# Patient Record
Sex: Female | Born: 1970 | Race: White | Hispanic: No | Marital: Married | Smoking: Current every day smoker
Health system: Southern US, Community
[De-identification: ages and names within clinical notes are randomized; demographics above are authoritative.]

## PROBLEM LIST (undated history)

## (undated) DIAGNOSIS — R059 Cough, unspecified: Secondary | ICD-10-CM

## (undated) DIAGNOSIS — R05 Cough: Secondary | ICD-10-CM

## (undated) DIAGNOSIS — J40 Bronchitis, not specified as acute or chronic: Secondary | ICD-10-CM

## (undated) HISTORY — DX: Bronchitis, not specified as acute or chronic: J40

## (undated) HISTORY — DX: Cough: R05

## (undated) HISTORY — DX: Cough, unspecified: R05.9

---

## 2000-09-14 ENCOUNTER — Other Ambulatory Visit: Admission: RE | Admit: 2000-09-14 | Discharge: 2000-09-14 | Payer: Self-pay | Admitting: Obstetrics and Gynecology

## 2002-04-02 ENCOUNTER — Other Ambulatory Visit: Admission: RE | Admit: 2002-04-02 | Discharge: 2002-04-02 | Payer: Self-pay | Admitting: Obstetrics and Gynecology

## 2002-08-07 ENCOUNTER — Other Ambulatory Visit: Admission: RE | Admit: 2002-08-07 | Discharge: 2002-08-07 | Payer: Self-pay | Admitting: Obstetrics and Gynecology

## 2002-08-07 ENCOUNTER — Other Ambulatory Visit: Admission: RE | Admit: 2002-08-07 | Discharge: 2002-08-07 | Payer: Self-pay | Admitting: Obstetrics & Gynecology

## 2002-09-25 ENCOUNTER — Inpatient Hospital Stay (HOSPITAL_COMMUNITY): Admission: AD | Admit: 2002-09-25 | Discharge: 2002-09-25 | Payer: Self-pay | Admitting: Obstetrics and Gynecology

## 2002-09-29 ENCOUNTER — Encounter: Payer: Self-pay | Admitting: Obstetrics and Gynecology

## 2002-09-29 ENCOUNTER — Ambulatory Visit (HOSPITAL_COMMUNITY): Admission: RE | Admit: 2002-09-29 | Discharge: 2002-09-29 | Payer: Self-pay | Admitting: Obstetrics and Gynecology

## 2002-10-12 ENCOUNTER — Inpatient Hospital Stay (HOSPITAL_COMMUNITY): Admission: AD | Admit: 2002-10-12 | Discharge: 2002-10-12 | Payer: Self-pay | Admitting: Obstetrics and Gynecology

## 2002-10-16 ENCOUNTER — Inpatient Hospital Stay (HOSPITAL_COMMUNITY): Admission: AD | Admit: 2002-10-16 | Discharge: 2002-10-16 | Payer: Self-pay | Admitting: Obstetrics and Gynecology

## 2002-10-18 ENCOUNTER — Inpatient Hospital Stay (HOSPITAL_COMMUNITY): Admission: AD | Admit: 2002-10-18 | Discharge: 2002-10-20 | Payer: Self-pay | Admitting: Obstetrics & Gynecology

## 2002-10-18 HISTORY — PX: OTHER SURGICAL HISTORY: SHX169

## 2011-07-27 ENCOUNTER — Encounter: Payer: Self-pay | Admitting: Pulmonary Disease

## 2011-07-27 ENCOUNTER — Ambulatory Visit (INDEPENDENT_AMBULATORY_CARE_PROVIDER_SITE_OTHER): Payer: 59 | Admitting: Pulmonary Disease

## 2011-07-27 VITALS — BP 102/64 | HR 92 | Temp 97.9°F | Ht 64.0 in | Wt 126.6 lb

## 2011-07-27 DIAGNOSIS — M545 Low back pain, unspecified: Secondary | ICD-10-CM

## 2011-07-27 DIAGNOSIS — F17200 Nicotine dependence, unspecified, uncomplicated: Secondary | ICD-10-CM

## 2011-07-27 DIAGNOSIS — J42 Unspecified chronic bronchitis: Secondary | ICD-10-CM

## 2011-07-27 DIAGNOSIS — F419 Anxiety disorder, unspecified: Secondary | ICD-10-CM

## 2011-07-27 DIAGNOSIS — F341 Dysthymic disorder: Secondary | ICD-10-CM

## 2011-07-27 DIAGNOSIS — F32A Depression, unspecified: Secondary | ICD-10-CM

## 2011-07-27 DIAGNOSIS — F17210 Nicotine dependence, cigarettes, uncomplicated: Secondary | ICD-10-CM

## 2011-07-27 DIAGNOSIS — R059 Cough, unspecified: Secondary | ICD-10-CM

## 2011-07-27 DIAGNOSIS — J209 Acute bronchitis, unspecified: Secondary | ICD-10-CM

## 2011-07-27 DIAGNOSIS — R05 Cough: Secondary | ICD-10-CM | POA: Insufficient documentation

## 2011-07-27 MED ORDER — METHYLPREDNISOLONE ACETATE 80 MG/ML IJ SUSP
80.0000 mg | Freq: Once | INTRAMUSCULAR | Status: AC
  Administered 2011-07-27: 80 mg via INTRAMUSCULAR

## 2011-07-28 ENCOUNTER — Encounter: Payer: Self-pay | Admitting: Pulmonary Disease

## 2011-07-28 ENCOUNTER — Ambulatory Visit (INDEPENDENT_AMBULATORY_CARE_PROVIDER_SITE_OTHER)
Admission: RE | Admit: 2011-07-28 | Discharge: 2011-07-28 | Disposition: A | Discharge status: Home / Self Care | Payer: Self-pay | Source: Ambulatory Visit | Attending: Pulmonary Disease | Admitting: Pulmonary Disease

## 2011-07-28 ENCOUNTER — Telehealth: Payer: Self-pay | Admitting: Pulmonary Disease

## 2011-07-28 DIAGNOSIS — M545 Low back pain, unspecified: Secondary | ICD-10-CM | POA: Insufficient documentation

## 2011-07-28 DIAGNOSIS — J209 Acute bronchitis, unspecified: Secondary | ICD-10-CM | POA: Insufficient documentation

## 2011-07-28 DIAGNOSIS — R05 Cough: Secondary | ICD-10-CM

## 2011-07-28 DIAGNOSIS — F419 Anxiety disorder, unspecified: Secondary | ICD-10-CM | POA: Insufficient documentation

## 2011-07-28 DIAGNOSIS — F17210 Nicotine dependence, cigarettes, uncomplicated: Secondary | ICD-10-CM | POA: Insufficient documentation

## 2011-07-28 NOTE — Progress Notes (Signed)
Subjective:    Patient ID: Ariel Garrett, female    DOB: 03-29-1971, 40 y.o.   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  Problem List:   CigSmoker>> Chronic Bronchitis w/ acute exac>>       She is a 1ppd smoker having started in her teens & smoked up to 1ppd, currently still at 1ppd; she describes a mild chronic cough in AMs w/ sm amt beige phlegm on most days; no hx freq or recurrent acute infections- w/o pneumonias/ bronchitis/ etc; she denies prev hx breathing problems & never been on inhalers etc (no hx asthma etc)...  Presents w/ 3wk hx URI starting w/ head congestion, drainage to chest, cough initially dry, then productive sm amt beige phlegm, no hemoptysis, denies f/c/s, denies reflux or nighttime exac;   Exam reveals decr BS & scat rhonchi w/ end exp wheezing & congestion;  CXR ordered & pending;  ==> we discussed Rx w/ smoking cessation, Advair 250Bid, Depo80, Prednisone 20mg  (#12- 3d tapering sched), ZPak, Mucinex, Fluids, and she may use Delsym for cough...  Rec follow up in ~40month w/ PFT at baseline to guide meds going forward & underline the critical need to quit smoking!!!  S/P BTL 12/03 by DrWein  DJD/ LBP > on DICLOFENAC 75mg  Bid PRN for pain...  ANXIETY & DEPRESSION > on ALPRAZOLAM 0.5mg  Prn for nerves, and CITALOPRAM 20mg  daily for depression...   Past Medical History  Diagnosis Date  . Bronchitis   . Cough     Past Surgical History  Procedure Date  . Tubaligation 10-18-02    Outpatient Encounter Prescriptions as of 07/27/2011  Medication Sig Dispense Refill  . ALPRAZolam (XANAX) 0.5 MG tablet Take 0.5 mg by mouth at bedtime as needed.        Marland Kitchen azithromycin (ZITHROMAX Z-PAK) 250 MG tablet Take 250 mg by mouth daily.        . citalopram (CELEXA) 20 MG tablet Take 20 mg by mouth daily.        . diclofenac (VOLTAREN) 75 MG EC tablet As  needed for back pain       . Fluticasone-Salmeterol (ADVAIR) 250-50 MCG/DOSE AEPB Inhale 1 puff into the lungs every 12 (twelve) hours.         Facility-Administered Encounter Medications as of 07/27/2011  Medication Dose Route Frequency Provider Last Rate Last Dose  . methylPREDNISolone acetate (DEPO-MEDROL) injection 80 mg  80 mg Intramuscular Once Michele Mcalpine, MD   80 mg at 07/27/11 1440    No Known Allergies   Current Medications, Allergies, Past Medical History, Past Surgical History, Family History, and Social History were reviewed in [employer] [facility] electronic medical record.    Review of Systems    Constitutional:  Denies F/C/S, anorexia, unexpected weight change. HEENT:  No HA, visual changes, earache, +nasal congestion/ drainage... Resp:  +cough, congestion, sl SOB, & end exp wheezing... Cardio:  No CP, palpit, orthopnea, edema. GI:  Denies N/V/D/C or blood in stool; no reflux, abd pain, distention, or gas. GU:  No dysuria, freq, urgency, hematuria, or flank pain. MS:  Denies joint pain, swelling, tenderness, or decr ROM; no neck pain, back pain, etc. Neuro:  No tremors, seizures, dizziness, syncope, weakness, numbness, gait abn. Skin:  No suspicious lesions or skin rash. Heme:  No adenopathy, bruising, bleeding. Psyche: Denies confusion, sleep disturbance, hallucinations, anxiety, depression.  Objective:   Physical Exam    WD, WN, 40 y/o WF in NAD... Vital Signs:  Reviewed & BP= 102/64 General:  Alert & oriented; pleasant & cooperative... HEENT:  [place]/AT, EOM-wnl, PERRLA, Fundi-benign, EACs-clear, TMs-wnl, NOSE- sl congested, THROAT-clear & wnl. Neck:  Supple w/ full ROM; no JVD; normal carotid impulses w/o bruits; no thyromegaly or nodules palpated; no lymphadenopathy. Chest:  Decr BS at bases w/ end-exp wheezing & rhonchi heard... Heart:  Regular Rhythm; norm S1 & S2 without murmurs/ rubs/ or gallops detected... Abdomen:  Soft & nontender; normal bowel sounds; no  organomegaly or masses palpated... Ext:  Normal ROM; without deformities or arthritic changes; no varicose veins, venous insuffic, or edema;  Pulses intact w/o bruits... Neuro:  CNs II-XII intact; motor testing normal; sensory testing normal; gait normal & balance OK... Derm:  No lesions noted; no rash etc... Lymph:  No cervical, supraclavicular, axillary, or inguinal adenopathy palpated...   CXR:  Pending- will review when avail...  Assessment & Plan:   Cigarette Smoker >> Prob Chronic Bronchitis w/ acute exacerbation precipitated by her recent URI. >>        We reviewed the pathophysiology of ChrBronchitis & it's relation to CigSmoking & implications for her future health;  Current acute exac is merely the tip of the iceberg so to speak & she understands this;  We discussed acute treatment w/ ZPak & Depo/ Prednisone, plus longer term [id number] & Mucinex/ Fluids;  Smoking cessation is imperitive & she was rec to call the 1-800-QUIT NOW phone line, try Nicotine replacement Rx avail OTC, E-cig, or let us know if she wants a prescription for Chantix...  We plan short term follow up & will consider PFTs when she is better & approaching her baseline...  LBP >> on Diclofenac as needed from her Primary care provider...  Anxiety/ Depression >> on Alprazolam & Celexa per her primary care provider.Marland KitchenMarland Kitchen

## 2011-07-28 NOTE — Telephone Encounter (Signed)
Called and spoke with pt about her test results per SN.  See result note.

## 2011-07-28 NOTE — Telephone Encounter (Signed)
SN, pls advise results, thanks!

## 2011-07-28 NOTE — Patient Instructions (Signed)
We reviewed the mechanism of your cough & it's relationship to your smoking & chronic lung disease...  You must quit smoking now >>     call the 1-800-QUIT NOW help line...    Try the OTC available nicotine replacement products (patches, gum, lozenges, etc)...    Try the E-cig products...    Let me know if you need a prescription for CHANTIX...   We gave you a ZPak for any residual bronchial infection... We gave you a Depo shot & a prescription for Prednisone to eliminate the bronchial tube inflammation that is making you cough... We decided to start ADVAIR 250- one inhalation twice daily, for intermediate term control of the inflammation... Be sure to start the [facility] 600mg  2 tabs twice daily w/ lots of fluids for the congestion & thick mucus plugs... You may use DELSYM OTC- 2 tsp twice daily as needed for severe coughing spells...  Today we did a baseline CXR> we will notify you of this result when avail...  Let's plan short term follow up in 4-6 weeks to check your progress.Marland KitchenMarland Kitchen

## 2011-08-08 ENCOUNTER — Telehealth: Payer: Self-pay | Admitting: *Deleted

## 2011-08-08 MED ORDER — FLUCONAZOLE 150 MG PO TABS: 150.0000 mg | ORAL_TABLET | Freq: Every day | ORAL | Status: AC

## 2011-08-08 NOTE — Telephone Encounter (Signed)
I had patient show her tongue to Dr Maple Hudson as SN and TP is out of the office. Per CY-okay to call in diflucan 150mg  #2 take 1 po qd x 2 days no refills. Rx sent to pharmacy and pt aware.

## 2012-07-18 ENCOUNTER — Encounter: Payer: Self-pay | Admitting: Pulmonary Disease

## 2014-04-05 ENCOUNTER — Inpatient Hospital Stay (HOSPITAL_COMMUNITY): Payer: 59

## 2014-04-05 ENCOUNTER — Emergency Department (HOSPITAL_COMMUNITY): Payer: 59

## 2014-04-05 ENCOUNTER — Inpatient Hospital Stay (HOSPITAL_COMMUNITY): Payer: 59 | Admitting: Anesthesiology

## 2014-04-05 ENCOUNTER — Inpatient Hospital Stay: Admit: 2014-04-05 | Payer: Self-pay | Admitting: Orthopedic Surgery

## 2014-04-05 ENCOUNTER — Encounter (HOSPITAL_COMMUNITY): Payer: 59 | Admitting: Anesthesiology

## 2014-04-05 ENCOUNTER — Encounter (HOSPITAL_COMMUNITY): Payer: Self-pay | Admitting: Anesthesiology

## 2014-04-05 ENCOUNTER — Encounter (HOSPITAL_COMMUNITY)
Admission: EM | Disposition: A | Discharge status: Home / Self Care | Payer: Self-pay | Source: Home / Self Care | Attending: Orthopedic Surgery

## 2014-04-05 ENCOUNTER — Observation Stay (HOSPITAL_COMMUNITY)
Admission: EM | Admit: 2014-04-05 | Discharge: 2014-04-06 | Disposition: A | Discharge status: Home / Self Care | Payer: 59 | Attending: Orthopedic Surgery | Admitting: Orthopedic Surgery

## 2014-04-05 DIAGNOSIS — W108XXA Fall (on) (from) other stairs and steps, initial encounter: Secondary | ICD-10-CM | POA: Insufficient documentation

## 2014-04-05 DIAGNOSIS — F17200 Nicotine dependence, unspecified, uncomplicated: Secondary | ICD-10-CM | POA: Insufficient documentation

## 2014-04-05 DIAGNOSIS — S82401A Unspecified fracture of shaft of right fibula, initial encounter for closed fracture: Secondary | ICD-10-CM

## 2014-04-05 DIAGNOSIS — S82201A Unspecified fracture of shaft of right tibia, initial encounter for closed fracture: Secondary | ICD-10-CM | POA: Diagnosis present

## 2014-04-05 DIAGNOSIS — S82409A Unspecified fracture of shaft of unspecified fibula, initial encounter for closed fracture: Principal | ICD-10-CM

## 2014-04-05 DIAGNOSIS — Y92009 Unspecified place in unspecified non-institutional (private) residence as the place of occurrence of the external cause: Secondary | ICD-10-CM | POA: Insufficient documentation

## 2014-04-05 DIAGNOSIS — S82839A Other fracture of upper and lower end of unspecified fibula, initial encounter for closed fracture: Secondary | ICD-10-CM

## 2014-04-05 DIAGNOSIS — S82309A Unspecified fracture of lower end of unspecified tibia, initial encounter for closed fracture: Secondary | ICD-10-CM

## 2014-04-05 DIAGNOSIS — S82209A Unspecified fracture of shaft of unspecified tibia, initial encounter for closed fracture: Principal | ICD-10-CM | POA: Diagnosis present

## 2014-04-05 HISTORY — PX: TIBIA IM NAIL INSERTION: SHX2516

## 2014-04-05 LAB — CBC WITH DIFFERENTIAL/PLATELET
Basophils Absolute: 0.1 K/uL (ref 0.0–0.1)
Basophils Relative: 1 % (ref 0–1)
Eosinophils Absolute: 0.1 K/uL (ref 0.0–0.7)
Eosinophils Relative: 1 % (ref 0–5)
HCT: 40.2 % (ref 36.0–46.0)
Hemoglobin: 13.8 g/dL (ref 12.0–15.0)
Lymphocytes Relative: 31 % (ref 12–46)
Lymphs Abs: 2.6 K/uL (ref 0.7–4.0)
MCH: 31.8 pg (ref 26.0–34.0)
MCHC: 34.3 g/dL (ref 30.0–36.0)
MCV: 92.6 fL (ref 78.0–100.0)
Monocytes Absolute: 0.5 K/uL (ref 0.1–1.0)
Monocytes Relative: 6 % (ref 3–12)
Neutro Abs: 5.1 K/uL (ref 1.7–7.7)
Neutrophils Relative %: 61 % (ref 43–77)
Platelets: 392 K/uL (ref 150–400)
RBC: 4.34 MIL/uL (ref 3.87–5.11)
RDW: 12.8 % (ref 11.5–15.5)
WBC: 8.4 K/uL (ref 4.0–10.5)

## 2014-04-05 LAB — BASIC METABOLIC PANEL
BUN: 7 mg/dL (ref 6–23)
CO2: 21 mEq/L (ref 19–32)
Calcium: 8.8 mg/dL (ref 8.4–10.5)
Chloride: 102 mEq/L (ref 96–112)
Creatinine, Ser: 0.66 mg/dL (ref 0.50–1.10)
GFR calc Af Amer: >90 mL/min (ref >90)
GFR calc non Af Amer: >90 mL/min (ref >90)
Glucose, Bld: 100 mg/dL — ABNORMAL HIGH (ref 70–99)
Potassium: 3.8 mEq/L (ref 3.7–5.3)
Sodium: 139 mEq/L (ref 137–147)

## 2014-04-05 LAB — MRSA PCR SCREENING: MRSA BY PCR: NEGATIVE

## 2014-04-05 LAB — ETHANOL: Alcohol, Ethyl (B): 237 mg/dL — ABNORMAL HIGH (ref 0–11)

## 2014-04-05 SURGERY — INSERTION, INTRAMEDULLARY ROD, TIBIA
Anesthesia: General | Site: Leg Lower | Laterality: Right

## 2014-04-05 MED ORDER — NEOSTIGMINE METHYLSULFATE 10 MG/10ML IV SOLN
INTRAVENOUS | Status: AC
  Filled 2014-04-05: qty 1

## 2014-04-05 MED ORDER — FOLIC ACID 1 MG PO TABS
1.0000 mg | ORAL_TABLET | Freq: Every day | ORAL | Status: DC
  Administered 2014-04-05 – 2014-04-06 (×2): 1 mg via ORAL
  Filled 2014-04-05 (×2): qty 1

## 2014-04-05 MED ORDER — HYDROMORPHONE HCL PF 1 MG/ML IJ SOLN
0.5000 mg | INTRAMUSCULAR | Status: DC | PRN
  Administered 2014-04-05 – 2014-04-06 (×5): 1 mg via INTRAVENOUS
  Filled 2014-04-05 (×5): qty 1

## 2014-04-05 MED ORDER — ONDANSETRON HCL 4 MG/2ML IJ SOLN
INTRAMUSCULAR | Status: AC
Start: 2014-04-05 — End: 2014-04-05
  Filled 2014-04-05: qty 2

## 2014-04-05 MED ORDER — THIAMINE HCL 100 MG/ML IJ SOLN
100.0000 mg | Freq: Every day | INTRAMUSCULAR | Status: DC
  Filled 2014-04-05 (×2): qty 1

## 2014-04-05 MED ORDER — HYDROMORPHONE HCL PF 1 MG/ML IJ SOLN: 1.0000 mg | INTRAMUSCULAR | Status: DC | PRN

## 2014-04-05 MED ORDER — HYDROMORPHONE HCL PF 1 MG/ML IJ SOLN
INTRAMUSCULAR | Status: AC
  Administered 2014-04-05: 0.5 mg via INTRAVENOUS
  Filled 2014-04-05: qty 1

## 2014-04-05 MED ORDER — CEFAZOLIN SODIUM 1-5 GM-% IV SOLN
INTRAVENOUS | Status: AC
  Filled 2014-04-05: qty 50

## 2014-04-05 MED ORDER — METHOCARBAMOL 1000 MG/10ML IJ SOLN
500.0000 mg | Freq: Four times a day (QID) | INTRAVENOUS | Status: DC | PRN
  Filled 2014-04-05: qty 5

## 2014-04-05 MED ORDER — FENTANYL CITRATE 0.05 MG/ML IJ SOLN
INTRAMUSCULAR | Status: DC | PRN
  Administered 2014-04-05 (×5): 50 ug via INTRAVENOUS

## 2014-04-05 MED ORDER — OXYCODONE HCL 5 MG PO TABS: 5.0000 mg | ORAL_TABLET | Freq: Once | ORAL | Status: AC | PRN

## 2014-04-05 MED ORDER — DIPHENHYDRAMINE HCL 12.5 MG/5ML PO ELIX: 12.5000 mg | ORAL_SOLUTION | ORAL | Status: DC | PRN

## 2014-04-05 MED ORDER — LIDOCAINE HCL (CARDIAC) 20 MG/ML IV SOLN
INTRAVENOUS | Status: AC
  Filled 2014-04-05: qty 5

## 2014-04-05 MED ORDER — ENOXAPARIN SODIUM 40 MG/0.4ML ~~LOC~~ SOLN
40.0000 mg | SUBCUTANEOUS | Status: DC
  Administered 2014-04-06: 40 mg via SUBCUTANEOUS
  Filled 2014-04-05 (×2): qty 0.4

## 2014-04-05 MED ORDER — CITALOPRAM HYDROBROMIDE 20 MG PO TABS
20.0000 mg | ORAL_TABLET | Freq: Every day | ORAL | Status: DC
  Administered 2014-04-05 – 2014-04-06 (×2): 20 mg via ORAL
  Filled 2014-04-05 (×2): qty 1

## 2014-04-05 MED ORDER — SUCCINYLCHOLINE CHLORIDE 20 MG/ML IJ SOLN
INTRAMUSCULAR | Status: DC | PRN
  Administered 2014-04-05: 100 mg via INTRAVENOUS

## 2014-04-05 MED ORDER — LORAZEPAM 1 MG PO TABS
1.0000 mg | ORAL_TABLET | Freq: Four times a day (QID) | ORAL | Status: DC | PRN
  Administered 2014-04-05 (×2): 1 mg via ORAL
  Filled 2014-04-05 (×2): qty 1

## 2014-04-05 MED ORDER — MIDAZOLAM HCL 2 MG/2ML IJ SOLN
2.0000 mg | Freq: Once | INTRAMUSCULAR | Status: AC
  Administered 2014-04-05: 1 mg via INTRAVENOUS
  Filled 2014-04-05: qty 2

## 2014-04-05 MED ORDER — ONDANSETRON HCL 4 MG PO TABS: 4.0000 mg | ORAL_TABLET | Freq: Four times a day (QID) | ORAL | Status: DC | PRN

## 2014-04-05 MED ORDER — CEFAZOLIN SODIUM-DEXTROSE 2-3 GM-% IV SOLR
2.0000 g | INTRAVENOUS | Status: AC
  Administered 2014-04-05: 2 g via INTRAVENOUS
  Filled 2014-04-05: qty 50

## 2014-04-05 MED ORDER — SODIUM CHLORIDE 0.9 % IV SOLN
INTRAVENOUS | Status: DC
  Administered 2014-04-05 – 2014-04-06 (×2): via INTRAVENOUS

## 2014-04-05 MED ORDER — HYDROMORPHONE HCL PF 1 MG/ML IJ SOLN
0.2500 mg | INTRAMUSCULAR | Status: DC | PRN
Start: 2014-04-05 — End: 2014-04-06
  Administered 2014-04-05 (×2): 0.5 mg via INTRAVENOUS

## 2014-04-05 MED ORDER — PROPOFOL 10 MG/ML IV BOLUS
INTRAVENOUS | Status: AC
  Filled 2014-04-05: qty 20

## 2014-04-05 MED ORDER — DOCUSATE SODIUM 100 MG PO CAPS
100.0000 mg | ORAL_CAPSULE | Freq: Two times a day (BID) | ORAL | Status: DC
  Administered 2014-04-05 – 2014-04-06 (×3): 100 mg via ORAL
  Filled 2014-04-05 (×2): qty 1

## 2014-04-05 MED ORDER — ALPRAZOLAM 0.5 MG PO TABS: 0.5000 mg | ORAL_TABLET | Freq: Every evening | ORAL | Status: DC | PRN

## 2014-04-05 MED ORDER — EPHEDRINE SULFATE 50 MG/ML IJ SOLN
INTRAMUSCULAR | Status: DC | PRN
Start: 2014-04-05 — End: 2014-04-05
  Administered 2014-04-05: 10 mg via INTRAVENOUS

## 2014-04-05 MED ORDER — ADULT MULTIVITAMIN W/MINERALS CH
1.0000 | ORAL_TABLET | Freq: Every day | ORAL | Status: DC
  Administered 2014-04-05 – 2014-04-06 (×2): 1 via ORAL
  Filled 2014-04-05 (×2): qty 1

## 2014-04-05 MED ORDER — LACTATED RINGERS IV SOLN
INTRAVENOUS | Status: DC | PRN
  Administered 2014-04-05: 07:00:00 via INTRAVENOUS

## 2014-04-05 MED ORDER — HYDROMORPHONE HCL PF 1 MG/ML IJ SOLN
1.0000 mg | INTRAMUSCULAR | Status: DC | PRN
  Administered 2014-04-05: 1 mg via INTRAVENOUS
  Filled 2014-04-05: qty 1

## 2014-04-05 MED ORDER — VITAMIN B-1 100 MG PO TABS
100.0000 mg | ORAL_TABLET | Freq: Every day | ORAL | Status: DC
  Administered 2014-04-05 – 2014-04-06 (×2): 100 mg via ORAL
  Filled 2014-04-05 (×2): qty 1

## 2014-04-05 MED ORDER — 0.9 % SODIUM CHLORIDE (POUR BTL) OPTIME
TOPICAL | Status: DC | PRN
  Administered 2014-04-05: 1000 mL

## 2014-04-05 MED ORDER — SUCCINYLCHOLINE CHLORIDE 20 MG/ML IJ SOLN
INTRAMUSCULAR | Status: AC
  Filled 2014-04-05: qty 1

## 2014-04-05 MED ORDER — LORAZEPAM 2 MG/ML IJ SOLN: 1.0000 mg | Freq: Four times a day (QID) | INTRAMUSCULAR | Status: DC | PRN

## 2014-04-05 MED ORDER — ARTIFICIAL TEARS OP OINT
TOPICAL_OINTMENT | OPHTHALMIC | Status: AC
  Filled 2014-04-05: qty 3.5

## 2014-04-05 MED ORDER — ARTIFICIAL TEARS OP OINT
TOPICAL_OINTMENT | OPHTHALMIC | Status: DC | PRN
  Administered 2014-04-05: 1 via OPHTHALMIC

## 2014-04-05 MED ORDER — NEOSTIGMINE METHYLSULFATE 10 MG/10ML IV SOLN
INTRAVENOUS | Status: DC | PRN
  Administered 2014-04-05: 4 mg via INTRAVENOUS

## 2014-04-05 MED ORDER — HYDROMORPHONE HCL PF 1 MG/ML IJ SOLN
1.0000 mg | Freq: Once | INTRAMUSCULAR | Status: AC
  Administered 2014-04-05: 1 mg via INTRAVENOUS

## 2014-04-05 MED ORDER — MIDAZOLAM HCL 2 MG/2ML IJ SOLN
INTRAMUSCULAR | Status: AC
  Filled 2014-04-05: qty 2

## 2014-04-05 MED ORDER — OXYCODONE HCL 5 MG/5ML PO SOLN: 5.0000 mg | Freq: Once | ORAL | Status: AC | PRN

## 2014-04-05 MED ORDER — LIDOCAINE HCL (CARDIAC) 20 MG/ML IV SOLN
INTRAVENOUS | Status: DC | PRN
  Administered 2014-04-05: 100 mg via INTRAVENOUS

## 2014-04-05 MED ORDER — PROPOFOL 10 MG/ML IV BOLUS
INTRAVENOUS | Status: DC | PRN
  Administered 2014-04-05: 150 mg via INTRAVENOUS
  Administered 2014-04-05: 50 mg via INTRAVENOUS

## 2014-04-05 MED ORDER — GLYCOPYRROLATE 0.2 MG/ML IJ SOLN
INTRAMUSCULAR | Status: DC | PRN
  Administered 2014-04-05: 0.6 mg via INTRAVENOUS

## 2014-04-05 MED ORDER — ACETAMINOPHEN 325 MG PO TABS: 650.0000 mg | ORAL_TABLET | Freq: Four times a day (QID) | ORAL | Status: DC | PRN

## 2014-04-05 MED ORDER — METHOCARBAMOL 500 MG PO TABS
500.0000 mg | ORAL_TABLET | Freq: Four times a day (QID) | ORAL | Status: DC | PRN
Start: 2014-04-05 — End: 2014-04-06
  Administered 2014-04-05 – 2014-04-06 (×3): 500 mg via ORAL
  Filled 2014-04-05 (×3): qty 1

## 2014-04-05 MED ORDER — SENNA 8.6 MG PO TABS
2.0000 | ORAL_TABLET | Freq: Two times a day (BID) | ORAL | Status: DC
  Administered 2014-04-05 – 2014-04-06 (×3): 17.2 mg via ORAL
  Filled 2014-04-05 (×4): qty 2

## 2014-04-05 MED ORDER — OXYCODONE HCL 5 MG PO TABS
5.0000 mg | ORAL_TABLET | ORAL | Status: DC | PRN
  Administered 2014-04-05 – 2014-04-06 (×3): 10 mg via ORAL
  Filled 2014-04-05 (×3): qty 2

## 2014-04-05 MED ORDER — HYDROMORPHONE HCL PF 1 MG/ML IJ SOLN
1.0000 mg | Freq: Once | INTRAMUSCULAR | Status: DC
  Filled 2014-04-05: qty 1

## 2014-04-05 MED ORDER — ONDANSETRON HCL 4 MG/2ML IJ SOLN
INTRAMUSCULAR | Status: DC | PRN
  Administered 2014-04-05: 4 mg via INTRAVENOUS

## 2014-04-05 MED ORDER — GLYCOPYRROLATE 0.2 MG/ML IJ SOLN
INTRAMUSCULAR | Status: AC
  Filled 2014-04-05: qty 2

## 2014-04-05 MED ORDER — ROCURONIUM BROMIDE 50 MG/5ML IV SOLN
INTRAVENOUS | Status: AC
  Filled 2014-04-05: qty 1

## 2014-04-05 MED ORDER — SODIUM CHLORIDE 0.9 % IV SOLN
INTRAVENOUS | Status: DC
  Administered 2014-04-05: 75 mL/h via INTRAVENOUS

## 2014-04-05 MED ORDER — FENTANYL CITRATE 0.05 MG/ML IJ SOLN
INTRAMUSCULAR | Status: AC
  Filled 2014-04-05: qty 5

## 2014-04-05 MED ORDER — PROMETHAZINE HCL 25 MG/ML IJ SOLN: 6.2500 mg | INTRAMUSCULAR | Status: DC | PRN

## 2014-04-05 MED ORDER — ONDANSETRON HCL 4 MG/2ML IJ SOLN: 4.0000 mg | Freq: Four times a day (QID) | INTRAMUSCULAR | Status: DC | PRN

## 2014-04-05 MED ORDER — CELECOXIB 200 MG PO CAPS
200.0000 mg | ORAL_CAPSULE | Freq: Every day | ORAL | Status: DC
  Administered 2014-04-05 – 2014-04-06 (×2): 200 mg via ORAL
  Filled 2014-04-05 (×2): qty 1

## 2014-04-05 MED ORDER — ROCURONIUM BROMIDE 100 MG/10ML IV SOLN
INTRAVENOUS | Status: DC | PRN
  Administered 2014-04-05: 40 mg via INTRAVENOUS
  Administered 2014-04-05: 10 mg via INTRAVENOUS

## 2014-04-05 MED ORDER — CEFAZOLIN SODIUM 1-5 GM-% IV SOLN
INTRAVENOUS | Status: DC | PRN
  Administered 2014-04-05: 1 g via INTRAVENOUS

## 2014-04-05 SURGICAL SUPPLY — 69 items
BANDAGE ELASTIC 4 VELCRO ST LF (GAUZE/BANDAGES/DRESSINGS) IMPLANT
BANDAGE ESMARK 6X9 LF (GAUZE/BANDAGES/DRESSINGS) ×1 IMPLANT
BIT DRILL 3.8X6 NS (BIT) ×3 IMPLANT
BIT DRILL 4.4 NS (BIT) ×3 IMPLANT
BIT DRILL WIN 3.0 (BIT) ×2 IMPLANT
BIT DRILL WIN 3.0MM (BIT) ×1
BNDG COHESIVE 4X5 TAN STRL (GAUZE/BANDAGES/DRESSINGS) ×3 IMPLANT
BNDG COHESIVE 6X5 TAN STRL LF (GAUZE/BANDAGES/DRESSINGS) ×3 IMPLANT
BNDG ESMARK 6X9 LF (GAUZE/BANDAGES/DRESSINGS) ×3
COVER SURGICAL LIGHT HANDLE (MISCELLANEOUS) ×3 IMPLANT
CUFF TOURNIQUET SINGLE 34IN LL (TOURNIQUET CUFF) ×3 IMPLANT
CUFF TOURNIQUET SINGLE 44IN (TOURNIQUET CUFF) IMPLANT
DRAPE C-ARM 42X72 X-RAY (DRAPES) ×3 IMPLANT
DRAPE INCISE IOBAN 66X45 STRL (DRAPES) ×3 IMPLANT
DRAPE ORTHO SPLIT 77X108 STRL (DRAPES) ×4
DRAPE SURG ORHT 6 SPLT 77X108 (DRAPES) ×2 IMPLANT
DRAPE U-SHAPE 47X51 STRL (DRAPES) ×3 IMPLANT
DRSG ADAPTIC 3X8 NADH LF (GAUZE/BANDAGES/DRESSINGS) ×3 IMPLANT
DRSG MEPILEX BORDER 4X4 (GAUZE/BANDAGES/DRESSINGS) ×3 IMPLANT
ELECT REM PT RETURN 9FT ADLT (ELECTROSURGICAL) ×3
ELECTRODE REM PT RTRN 9FT ADLT (ELECTROSURGICAL) ×1 IMPLANT
EVACUATOR 1/8 PVC DRAIN (DRAIN) IMPLANT
GLOVE BIO SURGEON STRL SZ 6.5 (GLOVE) ×2 IMPLANT
GLOVE BIO SURGEON STRL SZ7 (GLOVE) IMPLANT
GLOVE BIO SURGEON STRL SZ8 (GLOVE) ×3 IMPLANT
GLOVE BIO SURGEONS STRL SZ 6.5 (GLOVE) ×1
GLOVE BIOGEL PI IND STRL 6.5 (GLOVE) ×1 IMPLANT
GLOVE BIOGEL PI IND STRL 7.5 (GLOVE) IMPLANT
GLOVE BIOGEL PI IND STRL 8 (GLOVE) ×1 IMPLANT
GLOVE BIOGEL PI INDICATOR 6.5 (GLOVE) ×2
GLOVE BIOGEL PI INDICATOR 7.5 (GLOVE)
GLOVE BIOGEL PI INDICATOR 8 (GLOVE) ×2
GLOVE SS BIOGEL STRL SZ 7 (GLOVE) ×1 IMPLANT
GLOVE SUPERSENSE BIOGEL SZ 7 (GLOVE) ×2
GOWN STRL REUS W/ TWL LRG LVL3 (GOWN DISPOSABLE) ×2 IMPLANT
GOWN STRL REUS W/TWL LRG LVL3 (GOWN DISPOSABLE) ×4
GUIDEWIRE BALL NOSE 80CM (WIRE) ×3 IMPLANT
KIT BASIN OR (CUSTOM PROCEDURE TRAY) ×3 IMPLANT
KIT ROOM TURNOVER OR (KITS) ×3 IMPLANT
NAIL FLEXIBLE WIN 2.5MM (Nail) ×3 IMPLANT
NAIL TIBIAL 8MMX31.5CM (Nail) ×3 IMPLANT
PACK ORTHO EXTREMITY (CUSTOM PROCEDURE TRAY) ×3 IMPLANT
PAD ARMBOARD 7.5X6 YLW CONV (MISCELLANEOUS) ×3 IMPLANT
PAD CAST 4YDX4 CTTN HI CHSV (CAST SUPPLIES) ×1 IMPLANT
PADDING CAST COTTON 4X4 STRL (CAST SUPPLIES) ×2
PADDING CAST COTTON 6X4 STRL (CAST SUPPLIES) ×3 IMPLANT
PIN GUIDE ACE (PIN) ×3 IMPLANT
SCREW ACECAP 36MM (Screw) ×6 IMPLANT
SCREW ACECAP 40MM (Screw) ×3 IMPLANT
SCREW ACECAP 44MM (Screw) ×3 IMPLANT
SCREW ACECAP 50MM (Screw) ×3 IMPLANT
SCREW PROXIMAL DEPUY (Screw) ×4 IMPLANT
SCREW PRXML FT 40X5.5XNS LF (Screw) ×1 IMPLANT
SCREW PRXML FT 55X5.5XNS TIB (Screw) ×1 IMPLANT
SPONGE GAUZE 4X4 12PLY (GAUZE/BANDAGES/DRESSINGS) ×3 IMPLANT
SPONGE LAP 18X18 X RAY DECT (DISPOSABLE) ×3 IMPLANT
STAPLER VISISTAT 35W (STAPLE) ×3 IMPLANT
SUT ETHILON 3 0 PS 1 (SUTURE) ×6 IMPLANT
SUT MNCRL AB 3-0 PS2 18 (SUTURE) ×3 IMPLANT
SUT VIC AB 0 CT1 27 (SUTURE) ×2
SUT VIC AB 0 CT1 27XBRD ANBCTR (SUTURE) ×1 IMPLANT
SUT VIC AB 2-0 CT1 27 (SUTURE) ×2
SUT VIC AB 2-0 CT1 TAPERPNT 27 (SUTURE) ×1 IMPLANT
TOWEL OR 17X24 6PK STRL BLUE (TOWEL DISPOSABLE) ×3 IMPLANT
TOWEL OR 17X26 10 PK STRL BLUE (TOWEL DISPOSABLE) ×3 IMPLANT
TUBE CONNECTING 12'X1/4 (SUCTIONS) ×1
TUBE CONNECTING 12X1/4 (SUCTIONS) ×2 IMPLANT
UNDERPAD 30X30 INCONTINENT (UNDERPADS AND DIAPERS) ×3 IMPLANT
YANKAUER SUCT BULB TIP NO VENT (SUCTIONS) ×3 IMPLANT

## 2014-04-05 NOTE — Anesthesia Preprocedure Evaluation (Signed)
Anesthesia Evaluation  Patient identified by MRN, date of birth, ID band Patient awake    Reviewed: Allergy & Precautions, H&P   Airway Mallampati: I  Neck ROM: Full    Dental   Pulmonary COPDCurrent Smoker,  breath sounds clear to auscultation        Cardiovascular Rhythm:Regular Rate:Normal     Neuro/Psych    GI/Hepatic (+)     substance abuse  alcohol use,   Endo/Other    Renal/GU      Musculoskeletal   Abdominal   Peds  Hematology   Anesthesia Other Findings   Reproductive/Obstetrics                           Anesthesia Physical Anesthesia Plan  ASA: II  Anesthesia Plan: General   Post-op Pain Management:    Induction: Intravenous  Airway Management Planned: Oral ETT  Additional Equipment:   Intra-op Plan:   Post-operative Plan: Extubation in OR  Informed Consent: I have reviewed the patients History and Physical, chart, labs and discussed the procedure including the risks, benefits and alternatives for the proposed anesthesia with the patient or authorized representative who has indicated his/her understanding and acceptance.   Dental advisory given  Plan Discussed with: CRNA and Surgeon  Anesthesia Plan Comments:         Anesthesia Quick Evaluation

## 2014-04-05 NOTE — ED Provider Notes (Signed)
CSN: [id number]     Arrival date & time 04/05/14  0048 History   First MD Initiated Contact with Patient 04/05/14 0054     Chief Complaint  Patient presents with  . Ankle Injury     (Consider location/radiation/quality/duration/timing/severity/associated sxs/prior Treatment) HPI Patient presents to the emergency department with a right ankle injury.  Following a fall after stepping off a carport and rolling her ankle.  Patient, states, that she has normal sensation in her foot.  She said she is given a morphine in route for pain.  Patient, states, that she's not had any other injuries.  Patient denies loss consciousness, nausea, vomiting, headache, blurred vision, weakness, dizziness, numbness, or syncope patient, states, that movement and palpation make the pain, worse Past Medical History  Diagnosis Date  . Bronchitis   . Cough    Past Surgical History  Procedure Laterality Date  . Tubaligation  10-18-02   No family history on file. History  Substance Use Topics  . Smoking status: Current Every Day Smoker -- 1.00 packs/day for 15 years    Types: Cigarettes  . Smokeless tobacco: Not on file  . Alcohol Use: Yes     Comment: social use   OB History   Grav Para Term Preterm Abortions TAB SAB Ect Mult Living                 Review of Systems    Allergies  Review of patient's allergies indicates no known allergies.  Home Medications   Prior to Admission medications   Medication Sig Start Date End Date Taking? Authorizing Provider  ALPRAZolam Prudy Feeler) 0.5 MG tablet Take 0.5 mg by mouth at bedtime as needed.      Historical Provider, MD  azithromycin (ZITHROMAX Z-PAK) 250 MG tablet Take 250 mg by mouth daily.      Historical Provider, MD  citalopram (CELEXA) 20 MG tablet Take 20 mg by mouth daily.      Historical Provider, MD  diclofenac (VOLTAREN) 75 MG EC tablet As needed for back pain     Historical Provider, MD  Fluticasone-Salmeterol (ADVAIR) 250-50 MCG/DOSE AEPB Inhale  1 puff into the lungs every 12 (twelve) hours.      Historical Provider, MD   BP 96/72  Pulse 102  Temp(Src) 98.9 F (37.2 C) (Oral)  Resp 17  Ht 5\' 4"  (1.626 m)  Wt 135 lb (61.236 kg)  BMI 23.16 kg/m2  SpO2 92% Physical Exam  Nursing note and vitals reviewed. Constitutional: She is oriented to person, place, and time. She appears well-developed and well-nourished. No distress.  HENT:  Head: Normocephalic and atraumatic.  Mouth/Throat: Oropharynx is clear and moist.  Eyes: Pupils are equal, round, and reactive to light.  Neck: Normal range of motion. Neck supple.  Cardiovascular: Normal rate, regular rhythm and normal heart sounds.   Pulmonary/Chest: Effort normal.  Musculoskeletal:       Right ankle: She exhibits decreased range of motion, swelling and deformity. She exhibits no ecchymosis, no laceration and normal pulse. Achilles tendon normal.  Neurological: She is alert and oriented to person, place, and time.  Skin: Skin is warm and dry.    ED Course  Procedures (including critical care time) Labs Review Labs Reviewed  CBC WITH DIFFERENTIAL  BASIC METABOLIC PANEL  ETHANOL  PREGNANCY, URINE    Imaging Review Dg Ankle Complete Right  04/05/2014   CLINICAL DATA:  Larey Seat.  Injured ankle.  EXAM: RIGHT ANKLE - COMPLETE 3+ VIEW  COMPARISON:  None.  FINDINGS: There are comminuted and displaced fractures of the distal tibia and fibula. The ankle mortise is maintained. No intra-articular involvement. The mid and hindfoot bony structures are intact. Pes cavus is noted.  IMPRESSION: Severely comminuted and displaced fractures of the distal tibia and fibula not affecting the ankle joint.   Electronically Signed   By: Loralie ChampagneMark  Gallerani M.D.   On: 04/05/2014 01:28     EKG Interpretation   Date/Time:  Sunday April 05 2014 02:08:01 EDT Ventricular Rate:  115 PR Interval:  144 QRS Duration: 82 QT Interval:  325 QTC Calculation: 449 R Axis:   64 Text Interpretation:  Sinus tachycardia  with irregular rate No old tracing  to compare Confirmed by OTTER  MD, OLGA ([id number]) on 04/05/2014 2:10:13 AM      I spoke with Dr. Victorino DikeHewitt, orthopedic surgery.  Patient, states, that [facility] a closed reduction and splinting was performed.  [facility], and she'll have surgery done at 7:30 AM.  Patient, in stable here in the emergency department. patient was given a plan and all questions were answered.   Carlyle Dollyhristopher W Aerie Donica, PA-C 04/05/14 [phone]

## 2014-04-05 NOTE — ED Notes (Signed)
Ankle reduced to alignment and stabilized by Dr Norlene Campbell also time out performed prior to procedure

## 2014-04-05 NOTE — ED Notes (Signed)
Per EMS pt was walking and stepped off the carport and rolled her right ankle  Positive deformity noted  CMS checks wnl  Pt denies LOC or any other injuries  Pt received Morphine 7 milligrams in divided doses prior to arrival  Pt has a saline lock in her right AC with a #18 gauge

## 2014-04-05 NOTE — Brief Op Note (Signed)
04/05/2014  9:40 AM  PATIENT:  Ariel Garrett  43 y.o. female  PRE-OPERATIVE DIAGNOSIS:  Comminuted right tibial and fibular shaft fractures  POST-OPERATIVE DIAGNOSIS:  same  Procedure(s): 1.  Open treatment of right tibial shaft fracture with intramedullary nailing 2.  Open treatment of right fibular shaft fracture with intramedullary nailing.  SURGEON:  Toni Arthurs, MD  ASSISTANT: n/a  ANESTHESIA:   General  EBL:  minimal   TOURNIQUET:   Total Tourniquet Time Documented: Thigh (Right) - 61 minutes Total: Thigh (Right) - 61 minutes   COMPLICATIONS:  None apparent  DISPOSITION:  Extubated, awake and stable to recovery.  DICTATION ID:  [id number]

## 2014-04-05 NOTE — Discharge Instructions (Signed)
Toni Arthurs, MD [facility]  Please read the following information regarding your care after surgery.  STOP SMOKING!  Medications  You only need a prescription for the narcotic pain medicine (ex. oxycodone, Percocet, Norco).  All of the other medicines listed below are available over the counter. X acetominophen (Tylenol) 650 mg every 4-6 hours as you need for minor pain X oxycodone as prescribed for moderate to severe pain   Narcotic pain medicine (ex. oxycodone, Percocet, Vicodin) will cause constipation.  To prevent this problem, take the following medicines while you are taking any pain medicine. X docusate sodium (Colace) 100 mg twice a day X senna (Senokot) 2 tablets twice a day  X To help prevent blood clots, take Xarelto once a day for two weeks after surgery.  Then switch to aspirin 325 mg daily for a month.  You should also get up every hour while you are awake to move around.    Weight Bearing ? Bear weight when you are able on your operated leg or foot. ? Bear weight only on the heel of your operated foot in the post-op shoe. X Do not bear any weight on the operated leg or foot.  Cast / Splint / Dressing X Keep your splint or cast clean and dry.  Dont put anything (coat hanger, pencil, etc) down inside of it.  If it gets damp, use a hair dryer on the cool setting to dry it.  If it gets soaked, call the office to schedule an appointment for a cast change. ? Remove your dressing 3 days after surgery and cover the incisions with dry dressings.    After your dressing, cast or splint is removed; you may shower, but do not soak or scrub the wound.  Allow the water to run over it, and then gently pat it dry.  Swelling It is normal for you to have swelling where you had surgery.  To reduce swelling and pain, keep your toes above your nose for at least 3 days after surgery.  It may be necessary to keep your foot or leg elevated for several weeks.  If it hurts, it should be  elevated.  Follow Up Call my office at [phone] when you are discharged from the hospital or surgery center to schedule an appointment to be seen two weeks after surgery.  Call my office at [phone] if you develop a fever >101.5 F, nausea, vomiting, bleeding from the surgical site or severe pain.

## 2014-04-05 NOTE — Anesthesia Postprocedure Evaluation (Signed)
  Anesthesia Post-op Note  Patient: Ariel Garrett  Procedure(s) Performed: Procedure(s) with comments: INTRAMEDULLARY (IM) NAIL TIBIAL (Right) - retrograde fibula nail and antegrade tibia nail  Patient Location: PACU  Anesthesia Type:General  Level of Consciousness: awake and alert   Airway and Oxygen Therapy: Patient Spontanous Breathing  Post-op Pain: mild  Post-op Assessment: Post-op Vital signs reviewed  Post-op Vital Signs: stable  Last Vitals:  Filed Vitals:   04/05/14 1030  BP: 124/79  Pulse: 103  Temp:   Resp: 17    Complications: No apparent anesthesia complications

## 2014-04-05 NOTE — Op Note (Signed)
NAMLehman Garrett:  Tartaglia, Trameka              ACCOUNT NO.:  [id number]  MEDICAL RECORD NO.:  [id number]  LOCATION:  [id number]                        FACILITY:  [facility]  PHYSICIAN:  Toni ArthursJohn Christna Kulick, MD        DATE OF BIRTH:  05-Nov-1970  DATE OF PROCEDURE:  04/05/2014 DATE OF DISCHARGE:                              OPERATIVE REPORT   PREOPERATIVE DIAGNOSES:  Right tibial and fibular shaft fractures.  POSTOPERATIVE DIAGNOSES:  Right tibial and fibular shaft fractures.  PROCEDURES: 1. Open treatment of right tibial shaft fracture with intramedullary     nailing. 2. Open treatment of right fibular shaft fracture with intramedullary     nailing.  SURGEON:  Toni ArthursJohn Cheyenna Pankowski, M.D.  ANESTHESIA:  General.  ESTIMATED BLOOD LOSS:  Minimal.  TOURNIQUET TIME:  61 minutes at 250 mmHg.  COMPLICATIONS:  None.  APPARENT DISPOSITION:  Extubated awake and stable to recovery.  INDICATIONS FOR PROCEDURE:  The patient is a 43 year old female with past medical history for smoking.  She fell at home off a step at her carport.  Her husband thinks she may have tripped over a rout.  She was unable to bear weight after the injury.  She was brought to the emergency room where x-rays revealed comminuted fractures of the distal fibular and tibial shafts.  She presents now for operative treatment of this displaced unstable injuries.  She understands the risks and benefits, the alternative treatment options, and elects surgical treatment.  She specifically understands risks of bleeding, infection, nerve damage, blood clots, need for additional surgery, amputation, and death.  PROCEDURE IN DETAIL:  After preoperative consent was obtained, the correct operative site was identified.  The patient was brought to the operating room and placed supine on the operating table.  General anesthesia was induced.  Preoperative antibiotics were administered. Surgical time-out was taken.  The right lower extremity was prepped and draped in  standard sterile fashion with tourniquet around the thigh.  AP and lateral fluoroscopic images were obtained showing the fracture. Longitudinal traction was applied, and fractures were grossly reduced. A small longitudinal incision was then made at the tip of the fibula. Sharp dissection was carried down through skin and subcutaneous tissue. A 3 mm drill bit was used to open the tip of the fibula.  A Biomet titanium nail was inserted through the tip of the fibula and advanced proximally.  AP and lateral fluoroscopic images were utilized to ensure appropriate reduction of the fracture as the nail was advanced into the proximal fragment.  The nail was advanced past the isthmus of the fibular shaft.  AP and lateral radiographs confirmed appropriate reduction of the fracture and appropriate position of the intramedullary nail.  The nail was trimmed and impacted into position.  The wound was irrigated and closed with a nylon suture.  The extremity was then exsanguinated and the tourniquet was inflated to 250 mmHg.  A longitudinal lateral parapatellar incision was made.  Sharp dissection was carried down through the skin and subcutaneous tissue. The lateral retinaculum was incised, and the interval between the patellar tendon and the fat pad was developed.  A guide pin was inserted into the proximal tibia in line with the  medullary canal.  AP and lateral fluoroscopic images confirmed appropriate position of the guide pin.  The soft tissue protector was then utilized to introduce a starter reamer.  The tibial canal was opened appropriately.  The ball-tipped guidewire was then advanced into the medullary canal.  Fluoroscopic images in the AP and lateral planes confirmed appropriate position of the guide pin to the level of the fracture site.  The fracture was then held in a reduced position and the guide pin was passed across the fracture site down to the level of the tibial physeal scar.  AP  and lateral radiographs confirmed appropriate reduction of the fracture and the appropriate position of the guide pin.  The guide wire was then sequentially reamed to a 9.5 mm.  This gave excellent cortical chatter at the isthmus of the tibia.  An 8 mm x 31.5 cm Biomet nail was inserted.  This was a VersaNail.  It was impacted into position at the appropriate level distally.  The targeting guide was then used to insert 2 oblique screws proximally in percutaneous fashion.  The inserter handle was then removed.  The appropriate reduction was again confirmed with fluoroscopic images in the AP and lateral planes.  Perfect circle technique was then used to insert 3 interlocking screws at the distal end of the nail, 2 were medial to lateral, and 1 was anterior to posterior.  All were done in percutaneous fashion and were noted to be appropriately positioned on AP and lateral fluoroscopic images.  The wounds were irrigated copiously.  All the stab incisions were closed with horizontal mattress sutures of nylon.  The knee joint was irrigated copiously.  The retinaculum was closed with figure-of-eight sutures of 0 Vicryl.  Subcutaneous tissue was approximated with Monocryl, and the skin incision was closed with running 3-0 nylon.  Final AP and lateral images at the proximal end of the nail, the fracture site, and the ankle showed appropriate position and length of all hardware and appropriate reduction of the fracture sites.  The patient was then awakened from anesthesia and transported to the recovery room in stable condition.  FOLLOWUP PLAN:  The patient will be nonweightbearing on the right lower extremity.  She will start DVT prophylaxis and physical therapy.  DVT prophylaxis will be with Lovenox while in inpatient, and Xarelto as an outpatient.  She will likely be discharged to home tomorrow.     Toni Arthurs, MD     JH/MEDQ  D:  04/05/2014  T:  04/05/2014  Job:  [id number]

## 2014-04-05 NOTE — H&P (Signed)
Ariel Garrett is an 43 y.o. female.   Chief Complaint: right leg pain HPI:  43 y/o female with PMH of smoking fell stepping down from a carport last night.  She c/o severe pain in the right leg that is worse with motion and better with rest.  She denies numbness, tingling or weakness in the right foot.  No h/o injury or surgery to the right foot or ankle.  She last ate at 5:30 yesterday and last drank about 11 pm.  Her husband is at the bedside.  Past Medical History  Diagnosis Date  . Bronchitis   . Cough     Past Surgical History  Procedure Laterality Date  . Tubaligation  10-26-2002    FH:  Father died from pancreatic cancer.  Social History:  reports that she has been smoking Cigarettes.  She has a 15 pack-year smoking history. She does not have any smokeless tobacco history on file. She reports that she drinks alcohol. She reports that she does not use illicit drugs.  Allergies: No Known Allergies  Medications Prior to Admission  Medication Sig Dispense Refill  . ALPRAZolam (XANAX) 0.5 MG tablet Take 0.5 mg by mouth at bedtime as needed for anxiety.       . citalopram (CELEXA) 20 MG tablet Take 20 mg by mouth daily.          Results for orders placed during the hospital encounter of 04/05/14 (from the past 48 hour(s))  CBC WITH DIFFERENTIAL     Status: None   Collection Time    04/05/14  2:04 AM      Result Value Ref Range   WBC 8.4  4.0 - 10.5 K/uL   RBC 4.34  3.87 - 5.11 MIL/uL   Hemoglobin 13.8  12.0 - 15.0 g/dL   HCT 40.2  36.0 - 46.0 %   MCV 92.6  78.0 - 100.0 fL   MCH 31.8  26.0 - 34.0 pg   MCHC 34.3  30.0 - 36.0 g/dL   RDW 12.8  11.5 - 15.5 %   Platelets 392  150 - 400 K/uL   Neutrophils Relative % 61  43 - 77 %   Neutro Abs 5.1  1.7 - 7.7 K/uL   Lymphocytes Relative 31  12 - 46 %   Lymphs Abs 2.6  0.7 - 4.0 K/uL   Monocytes Relative 6  3 - 12 %   Monocytes Absolute 0.5  0.1 - 1.0 K/uL   Eosinophils Relative 1  0 - 5 %   Eosinophils Absolute 0.1  0.0 - 0.7  K/uL   Basophils Relative 1  0 - 1 %   Basophils Absolute 0.1  0.0 - 0.1 K/uL  BASIC METABOLIC PANEL     Status: Abnormal   Collection Time    04/05/14  2:04 AM      Result Value Ref Range   Sodium 139  137 - 147 mEq/L   Potassium 3.8  3.7 - 5.3 mEq/L   Chloride 102  96 - 112 mEq/L   CO2 21  19 - 32 mEq/L   Glucose, Bld 100 (*) 70 - 99 mg/dL   BUN 7  6 - 23 mg/dL   Creatinine, Ser 0.66  0.50 - 1.10 mg/dL   Calcium 8.8  8.4 - 10.5 mg/dL   GFR calc non Af Amer >90  >90 mL/min   GFR calc Af Amer >90  >90 mL/min   Comment: (NOTE)     The eGFR  has been calculated using the CKD EPI equation.     This calculation has not been validated in all clinical situations.     eGFR's persistently <90 mL/min signify possible Chronic Kidney     Disease.  ETHANOL     Status: Abnormal   Collection Time    04/13/14  2:04 AM      Result Value Ref Range   Alcohol, Ethyl (B) 237 (*) 0 - 11 mg/dL   Comment:            LOWEST DETECTABLE LIMIT FOR     SERUM ALCOHOL IS 11 mg/dL     FOR MEDICAL PURPOSES ONLY   Dg Ankle Complete Right  04/13/14   CLINICAL DATA:  [place].  Injured ankle.  EXAM: RIGHT ANKLE - COMPLETE 3+ VIEW  COMPARISON:  None.  FINDINGS: There are comminuted and displaced fractures of the distal tibia and fibula. The ankle mortise is maintained. No intra-articular involvement. The mid and hindfoot bony structures are intact. Pes cavus is noted.  IMPRESSION: Severely comminuted and displaced fractures of the distal tibia and fibula not affecting the ankle joint.   Electronically Signed   By: Kalman Jewels M.D.   On: Apr 13, 2014 01:28    ROS  No recent f/c/n/v/wt loss  Blood pressure 110/75, pulse 107, temperature 98.2 F (36.8 C), temperature source Oral, resp. rate 18, height 5' 4" (1.626 m), weight 61.236 kg (135 lb), SpO2 97.00%. Physical Exam  wn wd woman in nad.  A and O x 4.  Mood and affect normal.  EOMI.  resp unlabored.  R ankle splinted.  Feels LT normally at the toes.  5/5  strength in PF and DF of the toes.  Brisk cap refill at the teos.  No lymphadenopathy.    Assessment/Plan R tibia and fibula comminuted fractures - to OR for IM nail v. ORIF v. Ex fix.  The risks and benefits of the alternative treatment options have been discussed in detail.  The patient wishes to proceed with surgery and specifically understands risks of bleeding, infection, nerve damage, blood clots, need for additional surgery, amputation and death.   Ariel Garrett 04/13/2014, 7:00 AM

## 2014-04-05 NOTE — ED Notes (Signed)
Patient transported to X-ray 

## 2014-04-05 NOTE — Transfer of Care (Signed)
Immediate Anesthesia Transfer of Care Note  Patient: Ariel Garrett  Procedure(s) Performed: Procedure(s) with comments: INTRAMEDULLARY (IM) NAIL TIBIAL (Right) - retrograde fibula nail and antegrade tibia nail  Patient Location: PACU  Anesthesia Type:General  Level of Consciousness: awake, alert , oriented and sedated  Airway & Oxygen Therapy: Patient Spontanous Breathing and Patient connected to nasal cannula oxygen  Post-op Assessment: Report given to PACU RN, Post -op Vital signs reviewed and stable and Patient moving all extremities  Post vital signs: Reviewed and stable  Complications: No apparent anesthesia complications

## 2014-04-06 MED ORDER — RIVAROXABAN 10 MG PO TABS: 10.0000 mg | ORAL_TABLET | Freq: Every day | ORAL | Status: AC

## 2014-04-06 MED ORDER — DSS 100 MG PO CAPS: 100.0000 mg | ORAL_CAPSULE | Freq: Two times a day (BID) | ORAL | Status: AC

## 2014-04-06 MED ORDER — OXYCODONE HCL 5 MG PO TABS: 5.0000 mg | ORAL_TABLET | ORAL | Status: AC | PRN

## 2014-04-06 MED ORDER — SENNA 8.6 MG PO TABS: 2.0000 | ORAL_TABLET | Freq: Two times a day (BID) | ORAL | Status: AC

## 2014-04-06 NOTE — Care Management Note (Signed)
CARE MANAGEMENT NOTE 04/06/2014  Patient:  Ariel Garrett, Ariel Garrett   Account Number:  [id number]  Date Initiated:  04/06/2014  Documentation initiated by:  Vance Peper  Subjective/Objective Assessment:   43 yr old female s/p right tib/fib fracture with IM Nailing.     Action/Plan:   Case manager spoke with patient concerning home health and DME needs. at discharge. Choice offered. Referral called to [facility], [facility] liason. Patient has family support at discharge.   Anticipated DC Date:  04/06/2014   Anticipated DC Plan:  HOME W HOME HEALTH SERVICES      DC Planning Services  CM consult      [facility] Choice  HOME HEALTH  DURABLE MEDICAL EQUIPMENT   Choice offered to / List presented to:  C-1 Patient   DME arranged  3-N-1  Levan Hurst      DME agency  [facility]     HH arranged  HH-2 PT      [facility] agency  [facility]   Status of service:  Completed, signed off Medicare Important Message given?   (If response is "NO", the following Medicare IM given date fields will be blank) Date Medicare IM given:   Date Additional Medicare IM given:    Discharge Disposition:  HOME W HOME HEALTH SERVICES  Per UR Regulation:  Reviewed for med. necessity/level of care/duration of stay

## 2014-04-06 NOTE — Evaluation (Signed)
Physical Therapy Evaluation Patient Details Name: Ariel Garrett MRN: [id number] DOB: [birth year] Today's Date: 04/06/2014   History of Present Illness  43 y.o. female s/p retrograde IM nailing after sustaining a tibial/fibular fracture.  Clinical Impression  Patient is seen following the above procedure and presents with functional limitations due to the deficits listed below (see PT Problem List). Patient will benefit from skilled PT to increase their independence and safety with mobility to allow discharge to the venue listed below. Pt has safely completed stair and gait training. Much more stable with rolling walker vs crutches at this time.      Follow Up Recommendations Home health PT;Supervision for mobility/OOB    Equipment Recommendations  Rolling walker with 5" wheels;3in1 (PT)    Recommendations for Other Services OT consult     Precautions / Restrictions Precautions Precautions: None Restrictions Weight Bearing Restrictions: Yes RLE Weight Bearing: Non weight bearing      Mobility  Bed Mobility Overal bed mobility: Modified Independent             General bed mobility comments: Mod I with supine>sit - no VCs or assist needed, Demonstrates safely but requires extra time  Transfers Overall transfer level: Needs assistance Equipment used: Rolling walker (2 wheeled);Crutches Transfers: Sit to/from Stand Sit to Stand: Min guard         General transfer comment: Min guard for safety with VCs for hand placement and techniqiue with crutches and RW. Pt performed from EOB on lowest bed setting and from recliner x 2  Ambulation/Gait Ambulation/Gait assistance: Min guard Ambulation Distance (Feet): 30 Feet (x3) Assistive device: Rolling walker (2 wheeled);Crutches Gait Pattern/deviations:  ("hop-to" pattern)   Gait velocity interpretation: Below normal speed for age/gender General Gait Details: Gait training with crutches and RW with VCs for technique and safe  DME use. Pt performs more safely with RW and will therefore require this device over crutches at d/c. Demonstrates ability to safely maintain NWB status  Stairs Stairs: Yes Stairs assistance: +2 safety/equipment Stair Management: No rails;Step to pattern;Backwards;With walker Number of Stairs: 2 General stair comments: Min assist +2 to block RW from front with second person to guard pt from behind. Educated on sequencing and pt demonstrates correct understanding.   Wheelchair Mobility    Modified Rankin (Stroke Patients Only)       Balance                                             Pertinent Vitals/Pain 8/10 pain Nurse aware - states she has just received pain medication prior to PT session Patient repositioned in chair for comfort.     Home Living Family/patient expects to be discharged to:: Private residence Living Arrangements: Spouse/significant other;Children Available Help at Discharge: Family;Available 24 hours/day Type of Home: House Home Access: Stairs to enter Entrance Stairs-Rails: Doctor, general practice of Steps: 2 Home Layout: One level Home Equipment: Tub bench      Prior Function Level of Independence: Independent               Hand Dominance   Dominant Hand: Right    Extremity/Trunk Assessment   Upper Extremity Assessment: Overall WFL for tasks assessed           Lower Extremity Assessment: RLE deficits/detail RLE Deficits / Details: Decreased strength and ROM  - Complains of knee pain - unable to  fully assess due to bandaging       Communication   Communication: No difficulties  Cognition Arousal/Alertness: Awake/alert Behavior During Therapy: WFL for tasks assessed/performed Overall Cognitive Status: Within Functional Limits for tasks assessed                      General Comments      Exercises General Exercises - Lower Extremity Ankle Circles/Pumps: AROM;Left;10 reps;Seated Quad  Sets: AROM;Both;10 reps;Seated      Assessment/Plan    PT Assessment Patient needs continued PT services  PT Diagnosis Difficulty walking;Abnormality of gait;Acute pain   PT Problem List Decreased strength;Decreased range of motion;Decreased activity tolerance;Decreased balance;Decreased mobility;Decreased coordination;Decreased knowledge of use of DME;Decreased knowledge of precautions;Pain  PT Treatment Interventions DME instruction;Gait training;Stair training;Functional mobility training;Therapeutic activities;Therapeutic exercise;Balance training;Neuromuscular re-education;Patient/family education;Modalities   PT Goals (Current goals can be found in the Care Plan section) Acute Rehab PT Goals Patient Stated Goal: Stop hurting PT Goal Formulation: With patient Time For Goal Achievement: 04/13/14 Potential to Achieve Goals: Good    Frequency Min 5X/week   Barriers to discharge        Co-evaluation               End of Session Equipment Utilized During Treatment: Gait belt Activity Tolerance: Patient tolerated treatment well Patient left: in chair;with call bell/phone within reach Nurse Communication: Mobility status;Weight bearing status    Functional Assessment Tool Used: Clinical observation/assessment Functional Limitation: Mobility: Walking and moving around Mobility: Walking and Moving Around Current Status ([id number]([id number]): At least 20 percent but less than 40 percent impaired, limited or restricted Mobility: Walking and Moving Around Goal Status [phone](G8979): At least 1 percent but less than 20 percent impaired, limited or restricted    Time: 0847-0918 PT Time Calculation (min): 31 min   Charges:   PT Evaluation $Initial PT Evaluation Tier I: 1 Procedure PT Treatments $Gait Training: 8-22 mins   PT G Codes:   Functional Assessment Tool Used: Clinical observation/assessment Functional Limitation: Mobility: Walking and moving around   [employer] [place] [place],  [name]  Ariel Garrett 04/06/2014, 10:19 AM

## 2014-04-06 NOTE — Discharge Summary (Signed)
Physician Discharge Summary  Patient ID: Ariel Garrett MRN: [id number] DOB/AGE: 06/17/1971 43 y.o.  Admit date: 04/05/2014 Discharge date: 04/06/2014  Admission Diagnoses:  H/o smoking, right tibial and fibular shaft fractures  Discharge Diagnoses:  Active Problems:   Fracture, tibia and fibula   Closed fracture of right tibia and fibula s/p IM nailing of right tibial and fibular shaft fractures H/o smoking  Discharged Condition: stable  Hospital Course: the patient was admitted from the ED at [name] and taken to the OR where she underwent IM nailing of the right tibial and fibular shaft fractures.  She tolerated the procedure well and was transferred to 5N for PT.  She did well and was discharged home in stable condition.  Consults: None  Significant Diagnostic Studies: xrays  Treatments: therapies: PT  Discharge Exam: Blood pressure 134/75, pulse 109, temperature 98.3 F (36.8 C), temperature source Oral, resp. rate 18, height 5\' 4"  (1.626 m), weight 61.236 kg (135 lb), SpO2 97.00%. PE:  wn wd woman in nad.  a and O x 4.  normal mood and affect.  EOMI.  Resp unlabored.  R LE splinted.  NVI at the toes.  Disposition: to home in stable condition.  NWB on the R LE.  Discharge Instructions   Call MD / Call 911    Complete by:  As directed   If you experience chest pain or shortness of breath, CALL 911 and be transported to the hospital emergency room.  If you develope a fever above 101 F, pus (white drainage) or increased drainage or redness at the wound, or calf pain, call your surgeon's office.     Constipation Prevention    Complete by:  As directed   Drink plenty of fluids.  Prune juice may be helpful.  You may use a stool softener, such as Colace (over the counter) 100 mg twice a day.  Use MiraLax (over the counter) for constipation as needed.     Diet - low sodium heart healthy    Complete by:  As directed      Increase activity slowly as tolerated    Complete by:  As  directed      Non weight bearing    Complete by:  As directed   Laterality:  right  Extremity:  Lower            Medication List         ALPRAZolam 0.5 MG tablet  Commonly known as:  XANAX  Take 0.5 mg by mouth at bedtime as needed for anxiety.     citalopram 20 MG tablet  Commonly known as:  CELEXA  Take 20 mg by mouth daily.     DSS 100 MG Caps  Take 100 mg by mouth 2 (two) times daily.     oxyCODONE 5 MG immediate release tablet  Commonly known as:  Oxy IR/ROXICODONE  Take 1-2 tablets (5-10 mg total) by mouth every 4 (four) hours as needed for moderate pain or severe pain.     rivaroxaban 10 MG Tabs tablet  Commonly known as:  XARELTO  Take 1 tablet (10 mg total) by mouth daily.     senna 8.6 MG Tabs tablet  Commonly known as:  SENOKOT  Take 2 tablets (17.2 mg total) by mouth 2 (two) times daily.           Follow-up Information   Follow up with Toni Arthurs, MD In 2 weeks.   Specialty:  Orthopedic Surgery   Contact  information:   [address] [place] [address] [phone]       Signed: Toni ArthursJohn Abijah Roussel 04/06/2014, 7:21 AM

## 2014-04-07 ENCOUNTER — Encounter (HOSPITAL_COMMUNITY): Payer: Self-pay | Admitting: Orthopedic Surgery

## 2014-04-07 NOTE — ED Provider Notes (Signed)
Medical screening examination/treatment/procedure(s) were conducted as a shared visit with non-physician practitioner(s) and myself.  I personally evaluated the patient during the encounter.   EKG Interpretation   Date/Time:  Sunday April 05 2014 02:08:01 EDT Ventricular Rate:  115 PR Interval:  144 QRS Duration: 82 QT Interval:  325 QTC Calculation: 449 R Axis:   64 Text Interpretation:  Sinus tachycardia with irregular rate No old tracing  to compare Confirmed by Litzy Dicker  MD, Lavontay Kirk ([id number]) on 04/05/2014 2:10:13 AM      Pt seen by me with Ebbie Ridge, has displaced tib fib fracture on the right.  Using 1 mg of versed for anxiolysis, fracture was reduced and splinted.  Pt to be [facility] to [facility]  Reduction of dislocation Date/Time: 0300 Performed by: Olivia Mackie Authorized by: Olivia Mackie Consent: Verbal consent obtained. Risks and benefits: risks, benefits and alternatives were discussed Consent given by: patient Required items: required blood products, implants, devices, and special equipment available Time out: Immediately prior to procedure a "time out" was called to verify the correct patient, procedure, equipment, support staff and site/side marked as required.  Patient sedated: versed 1.0 mg given  Vitals: Vital signs were monitored during sedation. Patient tolerance: Patient tolerated the procedure well with no immediate complications. Joint: right lower leg Reduction technique: traction   Olivia Mackie, MD 04/07/14 [phone]

## 2014-08-28 ENCOUNTER — Other Ambulatory Visit: Payer: Self-pay | Admitting: Internal Medicine

## 2014-08-28 ENCOUNTER — Ambulatory Visit (INDEPENDENT_AMBULATORY_CARE_PROVIDER_SITE_OTHER)
Admission: RE | Admit: 2014-08-28 | Discharge: 2014-08-28 | Disposition: A | Discharge status: Home / Self Care | Payer: 59 | Source: Ambulatory Visit

## 2014-08-28 DIAGNOSIS — S82201G Unspecified fracture of shaft of right tibia, subsequent encounter for closed fracture with delayed healing: Secondary | ICD-10-CM

## 2014-08-28 DIAGNOSIS — S82401G Unspecified fracture of shaft of right fibula, subsequent encounter for closed fracture with delayed healing: Principal | ICD-10-CM

## 2014-08-28 DIAGNOSIS — S8291XG Unspecified fracture of right lower leg, subsequent encounter for closed fracture with delayed healing: Secondary | ICD-10-CM

## 2015-07-20 IMAGING — CR DG ANKLE COMPLETE 3+V*R*
3 series · 3 of 3 positions shown · non-contrast
Comparison: None.

CLINICAL DATA: Fell.  Injured ankle.

EXAM:
RIGHT ANKLE - COMPLETE 3+ VIEW

[x ankle ap right]
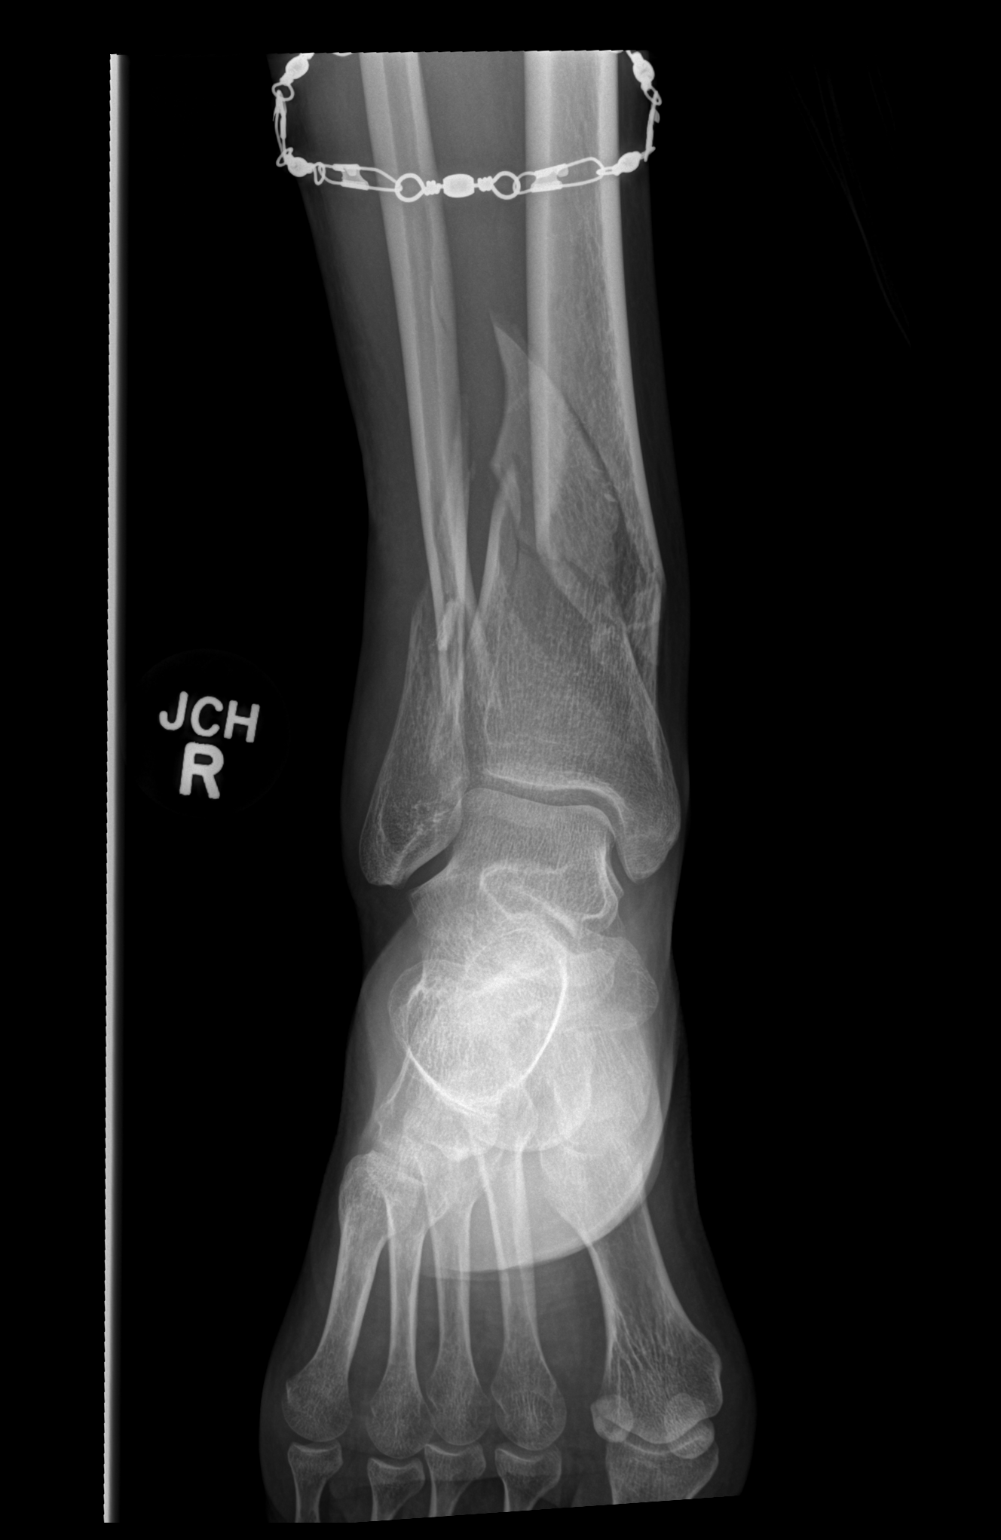

[x ankle obl right]
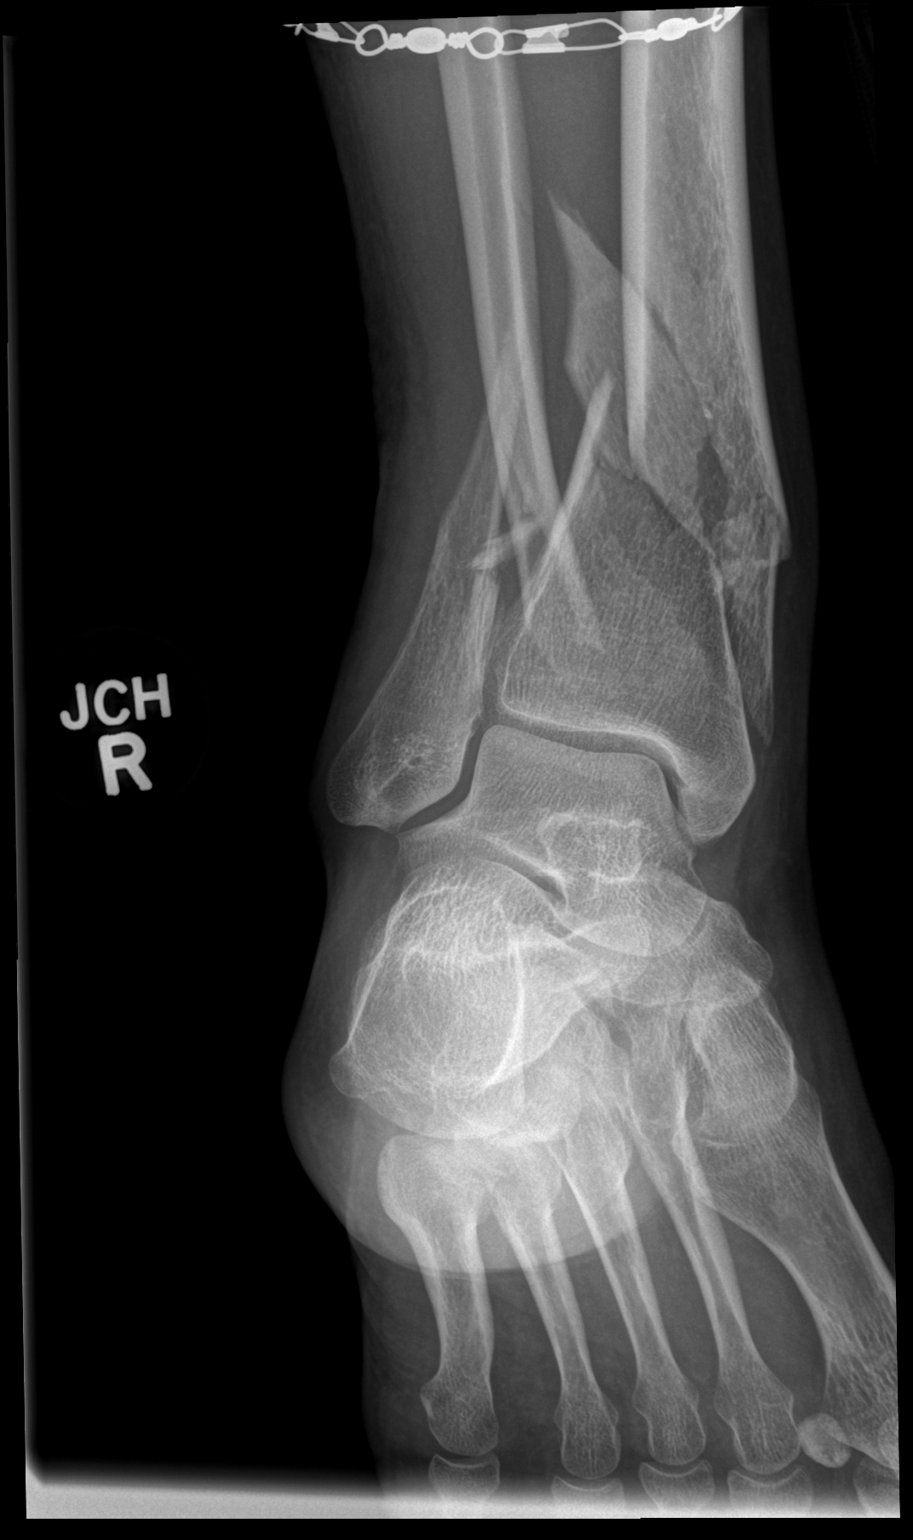

[x ankle lat right]
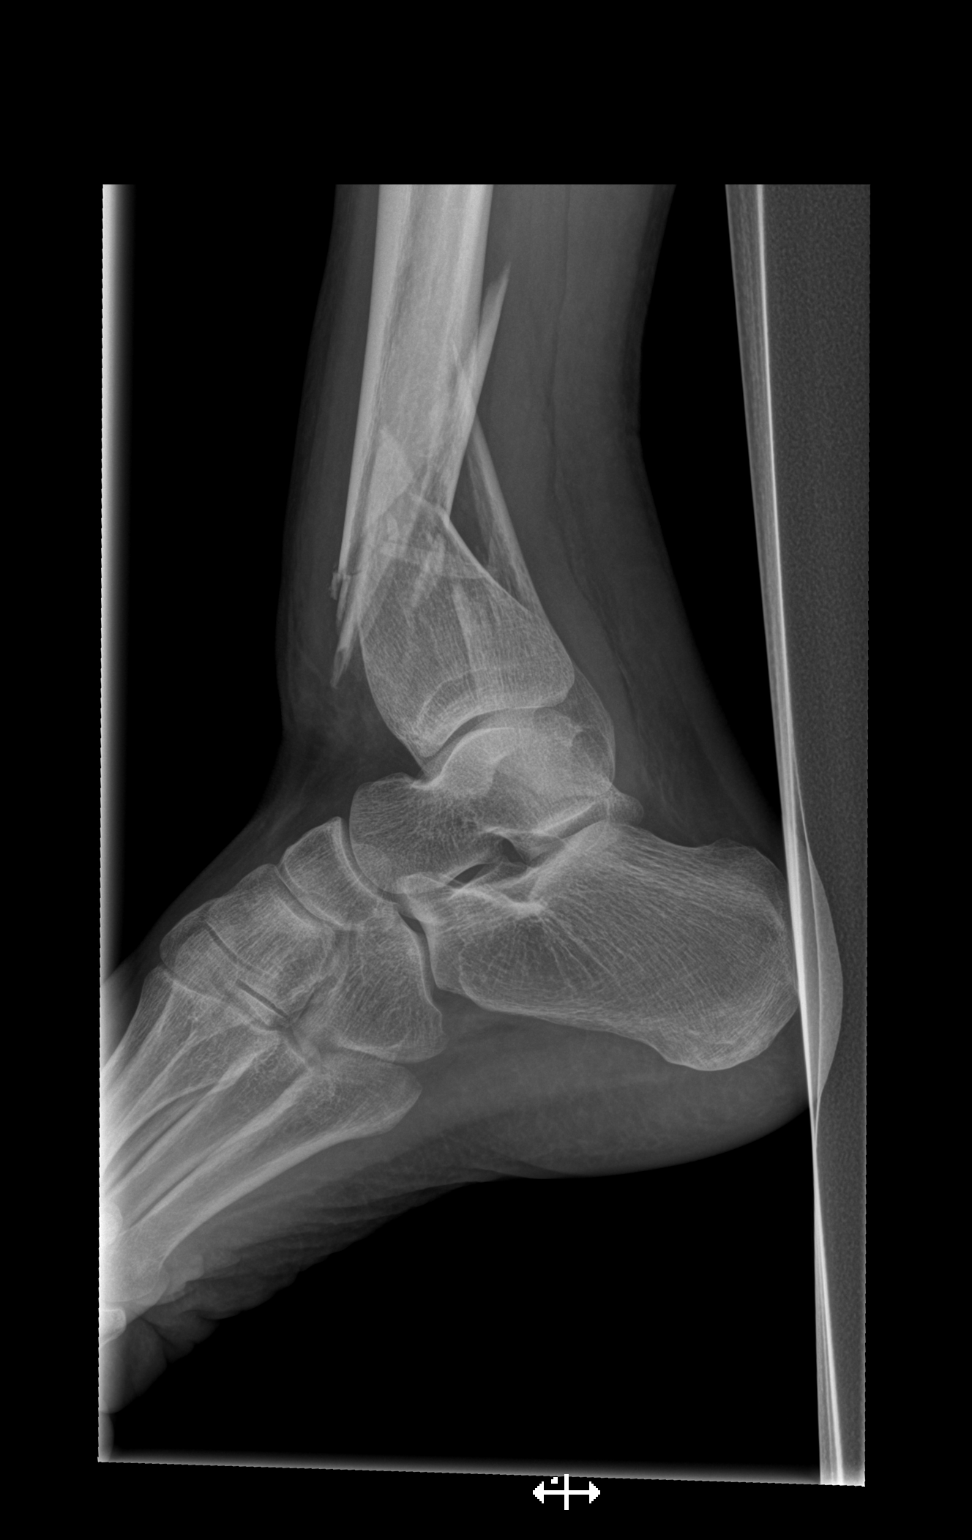

[3 of 3 positions shown; findings below may reference images not displayed]

FINDINGS: There are comminuted and displaced fractures of the distal tibia and
fibula. The ankle mortise is maintained. No intra-articular
involvement. The mid and hindfoot bony structures are intact. Do Ik
Jumper is noted.
IMPRESSION: Severely comminuted and displaced fractures of the distal tibia and
fibula not affecting the ankle joint.
